# Patient Record
Sex: Female | Born: 1965 | Race: Black or African American | Hispanic: No | Marital: Married | Smoking: Never smoker
Health system: Southern US, Community
[De-identification: ages and names within clinical notes are randomized; demographics above are authoritative.]

## PROBLEM LIST (undated history)

## (undated) HISTORY — PX: BREAST LUMPECTOMY: SHX2

## (undated) HISTORY — PX: HERNIA REPAIR: SHX51

## (undated) HISTORY — PX: BREAST SURGERY: SHX581

---

## 2007-08-06 ENCOUNTER — Ambulatory Visit: Payer: Self-pay | Admitting: Vascular Surgery

## 2010-09-12 ENCOUNTER — Emergency Department (HOSPITAL_BASED_OUTPATIENT_CLINIC_OR_DEPARTMENT_OTHER)
Admission: EM | Admit: 2010-09-12 | Discharge: 2010-09-12 | Payer: Self-pay | Source: Home / Self Care | Admitting: Emergency Medicine

## 2011-01-17 NOTE — Consult Note (Signed)
VASCULAR SURGERY CONSULTATION   Megan Nelson, Megan Nelson  DOB:  Aug 01, 1966                                       08/06/2007  ZHYQM#:[id number]   This is a vascular surgery consultation.   The patient is a 45 year old healthy female patient with a long history  of progressive prominent veins which have become quite painful in both  lower extremities.  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  Ambulation has also become painful to her.  She describes this as a  throbbing, sharp, and burning pain in the legs which involves the thighs  and calves.  She has no history of thrombophlebitis, deep venous  thrombosis, bleeding ulceration, or other venous problems but does  notice progressive swelling as the day wears on.  She has not worn true  elastic graduated compression stockings.  She does elevate the legs  which helps at times and has tried ibuprofen without success.   PAST MEDICAL HISTORY:  Negative for diabetes, hypertension, coronary  artery disease, hyperlipidemia, stroke, or COPD.   PREVIOUS SURGERY:  None.   FAMILY HISTORY:  Negative for coronary artery disease, diabetes, and  stroke.   SOCIAL HISTORY:  She is single, has 1 child, and works as a  Associate Professor.  She does not use tobacco or alcohol.   REVIEW OF SYSTEMS:  Please see health history form.   MEDICATIONS:  Please see health history form.   ALLERGIES:  NONE.   PHYSICAL EXAM:  Blood pressure is 136/80.  Heart rate is 72.  Respirations are 14.  GENERAL:  She is a healthy-appearing, middle-aged  female in no apparent distress.  She is alert and oriented x3.  NECK:  Supple with 3+ carotid pulses.  No bruits are audible.  There is no  palpable adenopathy in the neck.  Upper extremity pulses are 3+  bilaterally.  No skin rashes are noted.  CHEST:  Clear to  auscultation.  CARDIOVASCULAR:  Exam reveals regular rhythm with no murmurs.  ABDOMEN:  Soft and nontender with no palpable masses.  She has 3+ femoral,  popliteal, and posterior tibial pulses bilaterally.  There is no  hyperpigmentation, ulceration, or larger varicosities noted.  She does  have spider and reticular veins bilaterally, particularly in the  anterior and lateral thighs and also extending into the calves laterally  and medially, as well as some posteriorly in the proximal calf over the  popliteal area.  Venous duplex exam was performed in our office today  and both greater saphenous veins are competent.  There is mild  incompetence a the right saphenofemoral junction but the left  saphenofemoral junction is competent, as are both small saphenous veins.   I do not think that laser ablation is indicated in this young lady  although she does painful reticular and spider veins.  The best  treatment would be sclerotherapy and possible some skin laser  treatments.  She will consider this and be back in touch with [place] if she  would like to proceed.   Quita Skye Hart Rochester, M.D.  Electronically Signed  JDL/MEDQ  D:  08/06/2007  T:  08/07/2007  Job:  [id number]   cc:   Melina Schools  Kennon Portela, M.D.

## 2011-01-17 NOTE — Procedures (Signed)
LOWER EXTREMITY VENOUS REFLUX EXAM   INDICATION:  Complains of bilateral lower extremity pain and swelling,  which is worse with walking.  She denies a history of deep or  superficial venous thrombosis.   EXAM:  Using color-flow imaging and pulse Doppler spectral analysis, the  bilateral common femoral, superficial femoral, popliteal, posterior  tibial, greater and lesser saphenous veins are evaluated.  There is no  evidence suggesting deep venous insufficiency in the bilateral lower  extremities.   The left saphenofemoral junction is competent.  The right saphenofemoral  junction is incompetent.  The bilateral greater saphenous veins are  competent with the caliber as described below.   The bilateral proximal short saphenous veins demonstrate competency.   GSV Diameter (used if found to be incompetent only)                                            Right    Left  Proximal Greater Saphenous Vein           cm       cm  Proximal-to-mid-thigh                     cm       cm  Mid thigh                                 cm       cm  Mid-distal thigh                          cm       cm  Distal thigh                              cm       cm  Knee                                      cm       cm   IMPRESSION:  1. Bilateral greater saphenous veins are competent.  2. Bilateral greater saphenous veins are not aneurysmal.  3. Bilateral greater saphenous veins are not tortuous.  4. The bilateral deep venous system is competent.  5. The bilateral lesser saphenous veins are competent.   ___________________________________________  Quita Skye. Hart Rochester, M.D.   AR/MEDQ  D:  08/06/2007  T:  08/06/2007  Job:  [id number]

## 2015-01-15 ENCOUNTER — Emergency Department (HOSPITAL_BASED_OUTPATIENT_CLINIC_OR_DEPARTMENT_OTHER)
Admission: EM | Admit: 2015-01-15 | Discharge: 2015-01-15 | Disposition: A | Discharge status: Home / Self Care | Payer: 59 | Attending: Emergency Medicine | Admitting: Emergency Medicine

## 2015-01-15 ENCOUNTER — Emergency Department (HOSPITAL_BASED_OUTPATIENT_CLINIC_OR_DEPARTMENT_OTHER): Payer: 59

## 2015-01-15 ENCOUNTER — Encounter (HOSPITAL_BASED_OUTPATIENT_CLINIC_OR_DEPARTMENT_OTHER): Payer: Self-pay | Admitting: *Deleted

## 2015-01-15 DIAGNOSIS — M79602 Pain in left arm: Secondary | ICD-10-CM

## 2015-01-15 DIAGNOSIS — M79652 Pain in left thigh: Secondary | ICD-10-CM | POA: Diagnosis not present

## 2015-01-15 DIAGNOSIS — M542 Cervicalgia: Secondary | ICD-10-CM | POA: Diagnosis not present

## 2015-01-15 DIAGNOSIS — R0602 Shortness of breath: Secondary | ICD-10-CM | POA: Diagnosis not present

## 2015-01-15 DIAGNOSIS — R079 Chest pain, unspecified: Secondary | ICD-10-CM | POA: Diagnosis not present

## 2015-01-15 DIAGNOSIS — R51 Headache: Secondary | ICD-10-CM | POA: Insufficient documentation

## 2015-01-15 DIAGNOSIS — R42 Dizziness and giddiness: Secondary | ICD-10-CM | POA: Insufficient documentation

## 2015-01-15 LAB — TROPONIN I: Troponin I: <0.03 ng/mL (ref <0.031)

## 2015-01-15 LAB — CBC WITH DIFFERENTIAL/PLATELET
Basophils Absolute: 0 10*3/uL (ref 0.0–0.1)
Basophils Relative: 0 % (ref 0–1)
Eosinophils Absolute: 0.1 10*3/uL (ref 0.0–0.7)
Eosinophils Relative: 0 % (ref 0–5)
HCT: 37.9 % (ref 36.0–46.0)
Hemoglobin: 12.9 g/dL (ref 12.0–15.0)
Lymphocytes Relative: 17 % (ref 12–46)
Lymphs Abs: 2.5 10*3/uL (ref 0.7–4.0)
MCH: 28.9 pg (ref 26.0–34.0)
MCHC: 34 g/dL (ref 30.0–36.0)
MCV: 84.8 fL (ref 78.0–100.0)
Monocytes Absolute: 0.8 10*3/uL (ref 0.1–1.0)
Monocytes Relative: 6 % (ref 3–12)
Neutro Abs: 11.5 10*3/uL — ABNORMAL HIGH (ref 1.7–7.7)
Neutrophils Relative %: 77 % (ref 43–77)
Platelets: 222 10*3/uL (ref 150–400)
RBC: 4.47 MIL/uL (ref 3.87–5.11)
RDW: 13.7 % (ref 11.5–15.5)
WBC: 14.9 10*3/uL — ABNORMAL HIGH (ref 4.0–10.5)

## 2015-01-15 LAB — BASIC METABOLIC PANEL
Anion gap: 7 (ref 5–15)
BUN: 18 mg/dL (ref 6–20)
CO2: 25 mmol/L (ref 22–32)
Calcium: 8.9 mg/dL (ref 8.9–10.3)
Chloride: 105 mmol/L (ref 101–111)
Creatinine, Ser: 0.75 mg/dL (ref 0.44–1.00)
GFR calc Af Amer: >60 mL/min (ref >60)
GFR calc non Af Amer: >60 mL/min (ref >60)
Glucose, Bld: 93 mg/dL (ref 65–99)
Potassium: 3.6 mmol/L (ref 3.5–5.1)
Sodium: 137 mmol/L (ref 135–145)

## 2015-01-15 MED ORDER — TRAMADOL HCL 50 MG PO TABS: 50.0000 mg | ORAL_TABLET | Freq: Four times a day (QID) | ORAL | Status: DC | PRN

## 2015-01-15 NOTE — ED Provider Notes (Signed)
CSN: [id number]     Arrival date & time 01/15/15  1828 History  This chart was scribed for Megan RazorStephen Kenady Doxtater, MD by Octavia HeirArianna Nassar, ED Scribe. This patient was seen in room MH05/MH05 and the patient's care was started at 6:51 PM.   Chief Complaint  Patient presents with  . Chest Pain   The history is provided by the patient. No language interpreter was used.    HPI Comments: Megan Nelson is a 49 y.o. female who presents to the Emergency Department complaining of intermittent, moderate left sided arm and leg numbness and pain with associated symptoms of SOB onset last night. Patient states the episodes occur randomly and lasts for a few seconds. Patient also notes that she experienced anxiety, dizziness and inability to focus associated with left sided neck pain and left sided headache. Patient reports when she lifts her arm. Patient states she feels a tightness, "weighted feeling" in her chest. She notes when she feels the numbness in her leg the feeling radiates to her chest. Patient has not had a stress test and denies any history of having prior episodes.She denies nausea and diaphoresis. Patient denies past medical history of hypertension, DM, leg swelling, PE/DVT. Patient is not a smoker.   History reviewed. No pertinent past medical history. Past Surgical History  Procedure Laterality Date  . Hernia repair    . Breast surgery     History reviewed. No pertinent family history. History  Substance Use Topics  . Smoking status: Never Smoker   . Smokeless tobacco: Not on file  . Alcohol Use: No   OB History    No data available     Review of Systems  Constitutional: Negative for diaphoresis.  Respiratory: Positive for chest tightness and shortness of breath.   Cardiovascular: Positive for chest pain.  Gastrointestinal: Negative for nausea.  Musculoskeletal: Positive for myalgias, arthralgias and neck pain.  Neurological: Positive for dizziness, numbness and headaches.   Psychiatric/Behavioral: The patient is nervous/anxious.   All other systems reviewed and are negative.     Allergies  Review of patient's allergies indicates no known allergies.  Home Medications   Prior to Admission medications   Not on File    Triage vitals: BP 144/71 mmHg  Pulse 69  Temp(Src) 98.3 F (36.8 C) (Oral)  Resp 16  Ht 5\' 4"  (1.626 m)  Wt 169 lb (76.658 kg)  BMI 28.99 kg/m2  SpO2 100% Physical Exam  Constitutional: She is oriented to person, place, and time. She appears well-developed and well-nourished. No distress.  HENT:  Head: Normocephalic and atraumatic.  Eyes: Right eye exhibits no discharge. Left eye exhibits no discharge.  Neck:  Tenderness to left lateral neck  Cardiovascular: Normal rate.   Pulmonary/Chest: Effort normal and breath sounds normal. No respiratory distress.  Tenderness to left upper chest  Neurological: She is alert and oriented to person, place, and time. She displays normal reflexes. No cranial nerve deficit. She exhibits normal muscle tone. Coordination normal.  Good finger to nose bilaterally   Skin: No rash noted. She is not diaphoretic.  Psychiatric: She has a normal mood and affect. Her behavior is normal.  Nursing note and vitals reviewed.   ED Course  Procedures   DIAGNOSTIC STUDIES: Oxygen Saturation is 100% on RA, normal by my interpretation.  COORDINATION OF CARE:  6:57 PM Will order imaging and blood work. Patient agrees.  Labs Review Labs Reviewed  CBC WITH DIFFERENTIAL/PLATELET - Abnormal; Notable for the following:  WBC 14.9 (*)    Neutro Abs 11.5 (*)    All other components within normal limits  BASIC METABOLIC PANEL  TROPONIN I    Imaging Review Dg Chest 2 View  01/15/2015   CLINICAL DATA:  Chest pain.  EXAM: CHEST  2 VIEW  COMPARISON:  September 12, 2010.  FINDINGS: The heart size and mediastinal contours are within normal limits. Both lungs are clear. No pneumothorax or pleural effusion is  noted. The visualized skeletal structures are unremarkable.  IMPRESSION: No active cardiopulmonary disease.   Electronically Signed   By: Lupita RaiderJames  Green Jr, M.D.   On: 01/15/2015 20:11     EKG Interpretation   Date/Time:  Friday Jan 15 2015 18:50:37 EDT Ventricular Rate:  69 PR Interval:  184 QRS Duration: 82 QT Interval:  428 QTC Calculation: 458 R Axis:   -32 Text Interpretation:  Normal sinus rhythm Left axis deviation Abnormal ECG  ED PHYSICIAN INTERPRETATION AVAILABLE IN [facility] Confirmed by  TEST, Record ([id number]) on 01/16/2015 6:53:22 AM      MDM   Final diagnoses:  Chest pain  Left arm pain  Left thigh pain    49yF with multiple complaints. Cannot correlate all of them. Low suspicion for emergent process though. It has been determined that no acute conditions requiring further emergency intervention are present at this time. The patient has been advised of the diagnosis and plan. I reviewed any labs and imaging including any potential incidental findings. We have discussed signs and symptoms that warrant return to the ED and they are listed in the discharge instructions.    I personally preformed the services scribed in my presence. The recorded information has been reviewed is accurate. Megan RazorStephen Larena Ohnemus, MD.    Megan RazorStephen Thedora Rings, MD 01/17/15 [phone]

## 2015-01-15 NOTE — ED Notes (Signed)
Pt c/o left sided chest "tightness and numbness ' which radiates down left arm also c/o SOB dizziness

## 2015-01-15 NOTE — Discharge Instructions (Signed)

## 2015-03-25 ENCOUNTER — Other Ambulatory Visit: Payer: Self-pay

## 2015-03-25 DIAGNOSIS — Z1231 Encounter for screening mammogram for malignant neoplasm of breast: Secondary | ICD-10-CM

## 2015-03-29 ENCOUNTER — Ambulatory Visit
Admission: RE | Admit: 2015-03-29 | Discharge: 2015-03-29 | Disposition: A | Discharge status: Home / Self Care | Payer: 59 | Source: Ambulatory Visit

## 2015-03-29 DIAGNOSIS — Z1231 Encounter for screening mammogram for malignant neoplasm of breast: Secondary | ICD-10-CM

## 2015-03-30 ENCOUNTER — Other Ambulatory Visit: Payer: Self-pay | Admitting: Internal Medicine

## 2015-03-30 DIAGNOSIS — R928 Other abnormal and inconclusive findings on diagnostic imaging of breast: Secondary | ICD-10-CM

## 2015-04-05 ENCOUNTER — Ambulatory Visit
Admission: RE | Admit: 2015-04-05 | Discharge: 2015-04-05 | Disposition: A | Discharge status: Home / Self Care | Payer: 59 | Source: Ambulatory Visit | Attending: Internal Medicine | Admitting: Internal Medicine

## 2015-04-05 DIAGNOSIS — R928 Other abnormal and inconclusive findings on diagnostic imaging of breast: Secondary | ICD-10-CM

## 2017-09-25 ENCOUNTER — Encounter (HOSPITAL_BASED_OUTPATIENT_CLINIC_OR_DEPARTMENT_OTHER): Payer: Self-pay | Admitting: Emergency Medicine

## 2017-09-25 ENCOUNTER — Emergency Department (HOSPITAL_BASED_OUTPATIENT_CLINIC_OR_DEPARTMENT_OTHER): Payer: BLUE CROSS/BLUE SHIELD

## 2017-09-25 ENCOUNTER — Other Ambulatory Visit: Payer: Self-pay

## 2017-09-25 ENCOUNTER — Emergency Department (HOSPITAL_BASED_OUTPATIENT_CLINIC_OR_DEPARTMENT_OTHER)
Admission: EM | Admit: 2017-09-25 | Discharge: 2017-09-25 | Disposition: A | Discharge status: Home / Self Care | Payer: BLUE CROSS/BLUE SHIELD | Attending: Emergency Medicine | Admitting: Emergency Medicine

## 2017-09-25 DIAGNOSIS — R202 Paresthesia of skin: Secondary | ICD-10-CM | POA: Insufficient documentation

## 2017-09-25 DIAGNOSIS — R079 Chest pain, unspecified: Secondary | ICD-10-CM

## 2017-09-25 LAB — BASIC METABOLIC PANEL
ANION GAP: 6 (ref 5–15)
BUN: 16 mg/dL (ref 6–20)
CALCIUM: 8.5 mg/dL — AB (ref 8.9–10.3)
CO2: 23 mmol/L (ref 22–32)
Chloride: 107 mmol/L (ref 101–111)
Creatinine, Ser: 0.8 mg/dL (ref 0.44–1.00)
GFR calc Af Amer: >60 mL/min (ref >60)
GFR calc non Af Amer: >60 mL/min (ref >60)
Glucose, Bld: 92 mg/dL (ref 65–99)
Potassium: 3.8 mmol/L (ref 3.5–5.1)
Sodium: 136 mmol/L (ref 135–145)

## 2017-09-25 LAB — CBC WITH DIFFERENTIAL/PLATELET
BASOS PCT: 0 %
Basophils Absolute: 0 10*3/uL (ref 0.0–0.1)
EOS ABS: 0.2 10*3/uL (ref 0.0–0.7)
Eosinophils Relative: 2 %
HCT: 38.9 % (ref 36.0–46.0)
Hemoglobin: 13.1 g/dL (ref 12.0–15.0)
LYMPHS ABS: 2.3 10*3/uL (ref 0.7–4.0)
Lymphocytes Relative: 21 %
MCH: 28.6 pg (ref 26.0–34.0)
MCHC: 33.7 g/dL (ref 30.0–36.0)
MCV: 84.9 fL (ref 78.0–100.0)
Monocytes Absolute: 0.8 10*3/uL (ref 0.1–1.0)
Monocytes Relative: 8 %
Neutro Abs: 7.8 10*3/uL — ABNORMAL HIGH (ref 1.7–7.7)
Neutrophils Relative %: 69 %
Platelets: 232 10*3/uL (ref 150–400)
RBC: 4.58 MIL/uL (ref 3.87–5.11)
RDW: 14.1 % (ref 11.5–15.5)
WBC: 11.2 10*3/uL — ABNORMAL HIGH (ref 4.0–10.5)

## 2017-09-25 LAB — TROPONIN I: Troponin I: <0.03 ng/mL (ref <0.03)

## 2017-09-25 MED ORDER — ASPIRIN 81 MG PO CHEW
324.0000 mg | CHEWABLE_TABLET | Freq: Once | ORAL | Status: AC
  Administered 2017-09-25: 324 mg via ORAL
  Filled 2017-09-25: qty 4

## 2017-09-25 NOTE — Discharge Instructions (Signed)
You were evaluated in the emergency department for chest pain that was provoked by using a treadmill.  He also been experiencing some tingling in her right upper arm and right leg.  Your workup currently shows no answers for these symptoms.  You need to follow-up with your primary care doctor to undergo further testing by them.  We recommend you continue an 81 mg baby aspirin a day.  If your symptoms worsen you should represent to the hospital.

## 2017-09-25 NOTE — ED Triage Notes (Addendum)
Pt states she started having left sided numbness to her left arm, progressed to her left leg x 2 days.  Pt started having left sided chest tightness upon jogging on a treadmill.  Pt continues to have pain and numbness.  No cold symptoms, no N/V/D.  No fever.  No sob.

## 2017-09-25 NOTE — ED Provider Notes (Signed)
[facility] EMERGENCY DEPARTMENT Provider Note   CSN: [id number] Arrival date & time: 09/25/17  1057     History   Chief Complaint Chief Complaint  Patient presents with  . Chest Pain    tightness  . Numbness    left sided upper and lower    HPI Megan Nelson is a 52 y.o. female.  The history is provided by the patient.  Chest Pain   This is a new problem. The current episode started yesterday. The problem has been resolved. The pain is associated with exertion. The pain is moderate. The quality of the pain is described as pressure-like. The pain radiates to the left shoulder and left neck. The symptoms are aggravated by exertion. Pertinent negatives include no abdominal pain, no back pain, no cough, no diaphoresis, no dizziness, no fever, no nausea, no palpitations, no shortness of breath, no sputum production, no syncope and no vomiting. She has tried rest for the symptoms. The treatment provided significant relief.  Pertinent negatives for past medical history include no seizures.    52 year old female with no significant past medical history here with feeling left hand and shoulder numbness for 2 days, left thigh numbness for 1 day.  Last evening while using the treadmill which she does 2-3 times a week she experienced some chest tightness that radiated into her right shoulder.  It was moderate severity and improved as soon as she stopped jogging on the treadmill.  She is never had this before.  If she gets short of breath when using the treadmill but she attributes this to deconditioning and it was not worse yesterday.  There is no recent illness.  For the numbness in the left hand she attributes this to sleeping on her arm.  She has no prior history of cardiac disease.  There is no PE risk factors.  No past medical history on file.  There are no active problems to display for this patient.   Past Surgical History:  Procedure Laterality Date  . BREAST SURGERY      . HERNIA REPAIR      OB History    No data available       Home Medications    Prior to Admission medications   Not on File    Family History No family history on file.  Social History Social History   Tobacco Use  . Smoking status: Never Smoker  . Smokeless tobacco: Never Used  Substance Use Topics  . Alcohol use: No  . Drug use: No     Allergies   Patient has no known allergies.   Review of Systems Review of Systems  Constitutional: Negative for chills, diaphoresis and fever.  HENT: Negative for ear pain and sore throat.   Eyes: Negative for pain and visual disturbance.  Respiratory: Negative for cough, sputum production and shortness of breath.   Cardiovascular: Positive for chest pain. Negative for palpitations and syncope.  Gastrointestinal: Negative for abdominal pain, nausea and vomiting.  Genitourinary: Negative for dysuria and hematuria.  Musculoskeletal: Negative for arthralgias and back pain.  Skin: Negative for color change and rash.  Neurological: Negative for dizziness, seizures and syncope.  All other systems reviewed and are negative.    Physical Exam Updated Vital Signs BP 134/86   Pulse 62   Temp 98.4 F (36.9 C)   Resp 16   Ht 5\' 4"  (1.626 m)   Wt 83.9 kg (185 lb)   SpO2 100%   BMI 31.76  kg/m   Physical Exam  Constitutional: She is oriented to person, place, and time. She appears well-developed and well-nourished. No distress.  HENT:  Head: Normocephalic and atraumatic.  Eyes: Conjunctivae are normal.  Neck: Neck supple.  Cardiovascular: Normal rate and regular rhythm.  No murmur heard. Pulmonary/Chest: Effort normal and breath sounds normal. No respiratory distress.  Abdominal: Soft. There is no tenderness.  Musculoskeletal: She exhibits no edema.  Neurological: She is alert and oriented to person, place, and time. She has normal strength. No cranial nerve deficit or sensory deficit. She displays a negative Romberg sign.  Gait normal. GCS eye subscore is 4. GCS verbal subscore is 5. GCS motor subscore is 6.  Skin: Skin is warm and dry.  Psychiatric: She has a normal mood and affect.  Nursing note and vitals reviewed.    ED Treatments / Results  Labs (all labs ordered are listed, but only abnormal results are displayed) Labs Reviewed  BASIC METABOLIC PANEL  TROPONIN I  CBC WITH DIFFERENTIAL/PLATELET    EKG  EKG Interpretation  Date/Time:  Tuesday September 25 2017 11:06:30 EST Ventricular Rate:  70 PR Interval:  160 QRS Duration: 86 QT Interval:  430 QTC Calculation: 464 R Axis:   -39 Text Interpretation:  Normal sinus rhythm Left axis deviation Abnormal ECG no acute st/ts similar to prior 5/16 Confirmed by Meridee Score [phone]) on 09/25/2017 11:54:59 AM       Radiology Dg Chest 2 View  Result Date: 09/25/2017 CLINICAL DATA:  Two days of left hand numbness with onset of left-sided chest tightness today. Nonsmoker. EXAM: CHEST  2 VIEW COMPARISON:  PA and lateral chest x-ray of Jan 15, 2015 FINDINGS: The lungs are adequately inflated. There is no focal infiltrate. There is no pleural effusion. The heart and pulmonary vascularity are normal. The mediastinum is normal in width. There is gentle dextrocurvature centered in the lower thoracic spine which is stable. IMPRESSION: There is no pneumonia nor other acute cardiopulmonary abnormality. Electronically Signed   By: David  [place] M.D.   On: 09/25/2017 12:07   Ct Head Wo Contrast  Result Date: 09/25/2017 CLINICAL DATA:  Left-sided numbness for the past 2 days. EXAM: CT HEAD WITHOUT CONTRAST TECHNIQUE: Contiguous axial images were obtained from the base of the skull through the vertex without intravenous contrast. COMPARISON:  None. FINDINGS: Brain: No evidence of acute infarction, hemorrhage, hydrocephalus, extra-axial collection or mass lesion/mass effect. Vascular: No hyperdense vessel or unexpected calcification. Skull: Normal. Negative for fracture  or focal lesion. Sinuses/Orbits: No acute finding. Other: None. IMPRESSION: Normal noncontrast head CT. Electronically Signed   By: Obie Dredge M.D.   On: 09/25/2017 12:07    Procedures Procedures (including critical care time)  Medications Ordered in ED Medications  aspirin chewable tablet 324 mg (not administered)     Initial Impression / Assessment and Plan / ED Course  I have reviewed the triage vital signs and the nursing notes.  Pertinent labs & imaging results that were available during my care of the patient were reviewed by me and considered in my medical decision making (see chart for details).  Clinical Course as of Sep 26 1206  Tue Sep 25, 2017  1351 Patient's initial workup was negative for any signs of myocardial ischemia and also CT head was negative.  As she is having no symptoms right now I feel patient can be discharged to follow-up with her primary care doctor.  She is very comfortable with this plan.  We discussed that  she should take an aspirin a day and follow-up with her dog can restrict her activity to no significant aerobic activity until she gets an evaluation by her PCP.  [MB]    Clinical Course User Index [MB] Terrilee Files, MD   DDX - ACS, musculoskeletal, CVA, PTX.   Final Clinical Impressions(s) / ED Diagnoses   Final diagnoses:  Paresthesia  Chest pain in adult    ED Discharge Orders    None       Terrilee Files, MD 09/26/17 1210

## 2018-04-10 ENCOUNTER — Other Ambulatory Visit: Payer: Self-pay | Admitting: Internal Medicine

## 2018-04-10 DIAGNOSIS — Z1231 Encounter for screening mammogram for malignant neoplasm of breast: Secondary | ICD-10-CM

## 2018-05-13 ENCOUNTER — Ambulatory Visit: Payer: BLUE CROSS/BLUE SHIELD

## 2018-05-20 ENCOUNTER — Ambulatory Visit
Admission: RE | Admit: 2018-05-20 | Discharge: 2018-05-20 | Disposition: A | Discharge status: Home / Self Care | Payer: BLUE CROSS/BLUE SHIELD | Source: Ambulatory Visit | Attending: Internal Medicine | Admitting: Internal Medicine

## 2018-05-20 DIAGNOSIS — Z1231 Encounter for screening mammogram for malignant neoplasm of breast: Secondary | ICD-10-CM

## 2018-05-21 LAB — HM COLONOSCOPY

## 2018-05-28 DIAGNOSIS — Z6829 Body mass index (BMI) 29.0-29.9, adult: Secondary | ICD-10-CM | POA: Diagnosis not present

## 2018-05-28 DIAGNOSIS — E663 Overweight: Secondary | ICD-10-CM | POA: Diagnosis not present

## 2018-08-20 ENCOUNTER — Encounter: Payer: Self-pay | Admitting: Internal Medicine

## 2018-08-20 ENCOUNTER — Ambulatory Visit: Payer: BLUE CROSS/BLUE SHIELD | Admitting: Internal Medicine

## 2018-08-20 VITALS — BP 110/60 | HR 68 | Temp 98.7°F | Ht 63.0 in | Wt 175.8 lb

## 2018-08-20 DIAGNOSIS — Z6831 Body mass index (BMI) 31.0-31.9, adult: Secondary | ICD-10-CM | POA: Diagnosis not present

## 2018-08-20 DIAGNOSIS — E6609 Other obesity due to excess calories: Secondary | ICD-10-CM | POA: Insufficient documentation

## 2018-08-20 DIAGNOSIS — R635 Abnormal weight gain: Secondary | ICD-10-CM | POA: Diagnosis not present

## 2018-08-20 NOTE — Patient Instructions (Addendum)
Exercising to Lose Weight Exercising can help you to lose weight. In order to lose weight through exercise, you need to do vigorous-intensity exercise. You can tell that you are exercising with vigorous intensity if you are breathing very hard and fast and cannot hold a conversation while exercising. Moderate-intensity exercise helps to maintain your current weight. You can tell that you are exercising at a moderate level if you have a higher heart rate and faster breathing, but you are still able to hold a conversation. How often should I exercise? Choose an activity that you enjoy and set realistic goals. Your health care provider can help you to make an activity plan that works for you. Exercise regularly as directed by your health care provider. This may include:  Doing resistance training twice each week, such as: ? Push-ups. ? Sit-ups. ? Lifting weights. ? Using resistance bands.  Doing a given intensity of exercise for a given amount of time. Choose from these options: ? 150 minutes of moderate-intensity exercise every week. ? 75 minutes of vigorous-intensity exercise every week. ? A mix of moderate-intensity and vigorous-intensity exercise every week.  Children, pregnant women, people who are out of shape, people who are overweight, and older adults may need to consult a health care provider for individual recommendations. If you have any sort of medical condition, be sure to consult your health care provider before starting a new exercise program. What are some activities that can help me to lose weight?  Walking at a rate of at least 4.5 miles an hour.  Jogging or running at a rate of 5 miles per hour.  Biking at a rate of at least 10 miles per hour.  Lap swimming.  Roller-skating or in-line skating.  Cross-country skiing.  Vigorous competitive sports, such as football, basketball, and soccer.  Jumping rope.  Aerobic dancing. How can I be more active in my day-to-day  activities?  Use the stairs instead of the elevator.  Take a walk during your lunch break.  If you drive, park your car farther away from work or school.  If you take public transportation, get off one stop early and walk the rest of the way.  Make all of your phone calls while standing up and walking around.  Get up, stretch, and walk around every 30 minutes throughout the day. What guidelines should I follow while exercising?  Do not exercise so much that you hurt yourself, feel dizzy, or get very short of breath.  Consult your health care provider prior to starting a new exercise program.  Wear comfortable clothes and shoes with good support.  Drink plenty of water while you exercise to prevent dehydration or heat stroke. Body water is lost during exercise and must be replaced.  Work out until you breathe faster and your heart beats faster. This information is not intended to replace advice given to you by your health care provider. Make sure you discuss any questions you have with your health care provider. Document Released: 09/23/2010 Document Revised: 01/27/2016 Document Reviewed: 01/22/2014 Elsevier Interactive Patient Education  2018 [employer]  e

## 2018-09-08 ENCOUNTER — Encounter: Payer: Self-pay | Admitting: Internal Medicine

## 2018-09-08 NOTE — Progress Notes (Signed)
  Subjective:     Patient ID: Megan Nelson , female    DOB: [birth year] , 53 y.o.   MRN: [id number]   Chief Complaint  Patient presents with  . Obesity    HPI  She is here today for f/u obesity. She has stopped using the Saxenda. She does not wish to continue with this medication. She wants to try to lose remaining weight with diet/exercise alone. Additionally, she reports she has chosen a new insurance due to rising costs and will have to find a new pcp next year.     History reviewed. No pertinent past medical history.   Family History  Problem Relation Age of Onset  . Peripheral Artery Disease Mother   . Diabetes Father   . High blood pressure Father      Current Outpatient Medications:  .  cholecalciferol (VITAMIN D3) 25 MCG (1000 UT) tablet, Take 1,000 Units by mouth daily., Disp: , Rfl:  .  levonorgestrel (MIRENA) 20 MCG/24HR IUD, 1 each by Intrauterine route once. Every 5 years, Disp: , Rfl:  .  Liraglutide -Weight Management (SAXENDA [place]), Inject into the skin. Inject 0.43ml by subcutaneous route into the abdomen, thigh or upper arm, Disp: , Rfl:    No Known Allergies   Review of Systems  Constitutional: Negative.   Respiratory: Negative.   Cardiovascular: Negative.   Gastrointestinal: Negative.   Neurological: Negative.   Psychiatric/Behavioral: Negative.      Today's Vitals   08/20/18 1501  BP: 110/60  Pulse: 68  Temp: 98.7 F (37.1 C)  TempSrc: Oral  SpO2: 96%  Weight: 175 lb 12.8 oz (79.7 kg)  Height: 5\' 3"  (1.6 m)  PainSc: 0-No pain   Body mass index is 31.14 kg/m.   Objective:  Physical Exam Vitals signs and nursing note reviewed.  Constitutional:      Appearance: Normal appearance. She is obese.  HENT:     Head: Normocephalic and atraumatic.  Cardiovascular:     Rate and Rhythm: Normal rate and regular rhythm.     Heart sounds: Normal heart sounds.  Pulmonary:     Effort: Pulmonary effort is normal.     Breath sounds: Normal breath  sounds.  Skin:    General: Skin is warm.  Neurological:     General: No focal deficit present.     Mental Status: She is alert.         Assessment And Plan:     1. Abnormal weight gain  Weights reviewed from [employer] (previous charting system). She is encouraged to engage in regular exercise - 30-45 minutes five to six days weekly. She is also encouraged to avoid processed foods including deli meats, white breads, rice and pasta. She already drinks greater than 64 ounces of water daily. Pt advised that I am sorry to see her go, but of course I understand. She has requested recommendation for [facility] provider. Pt advised I will ask around and get back to her.  All questions were answered to her satisfaction.   2. Class 1 obesity due to excess calories without serious comorbidity with body mass index (BMI) of 31.0 to 31.9 in adult  She is encouraged to strive for BMI less than 27 to decrease cardiac risk.   Gwynneth Aliment, MD

## 2020-01-13 IMAGING — MG DIGITAL SCREENING BILATERAL MAMMOGRAM WITH TOMO AND CAD
8 series · 8 of 24 positions shown · non-contrast
Comparison: Previous exam(s).

CLINICAL DATA: Screening.

EXAM:
DIGITAL SCREENING BILATERAL MAMMOGRAM WITH TOMO AND CAD

[L MLO synth-2D]
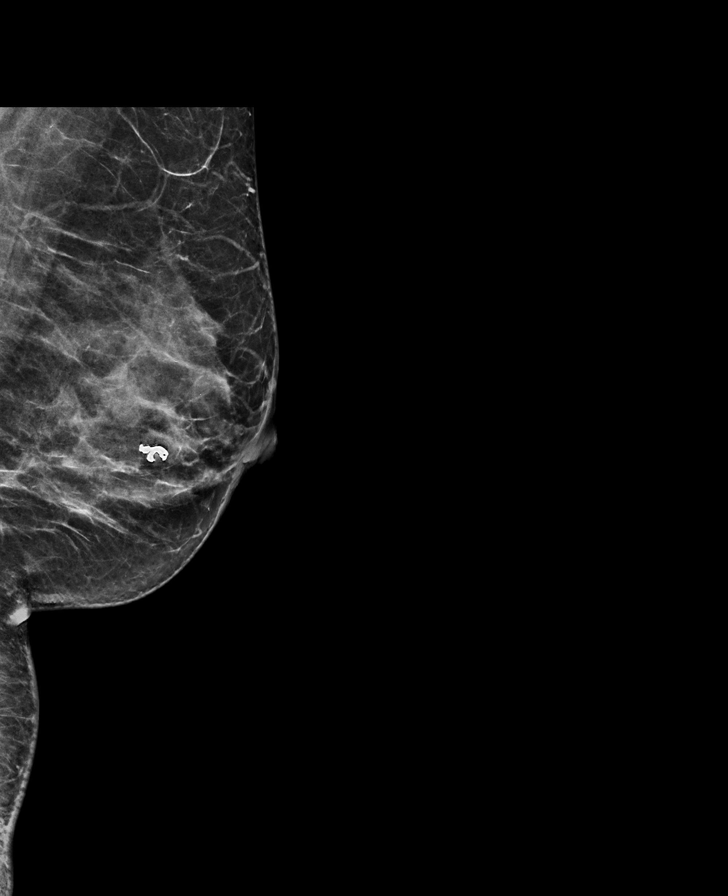

[L CC synth-2D]
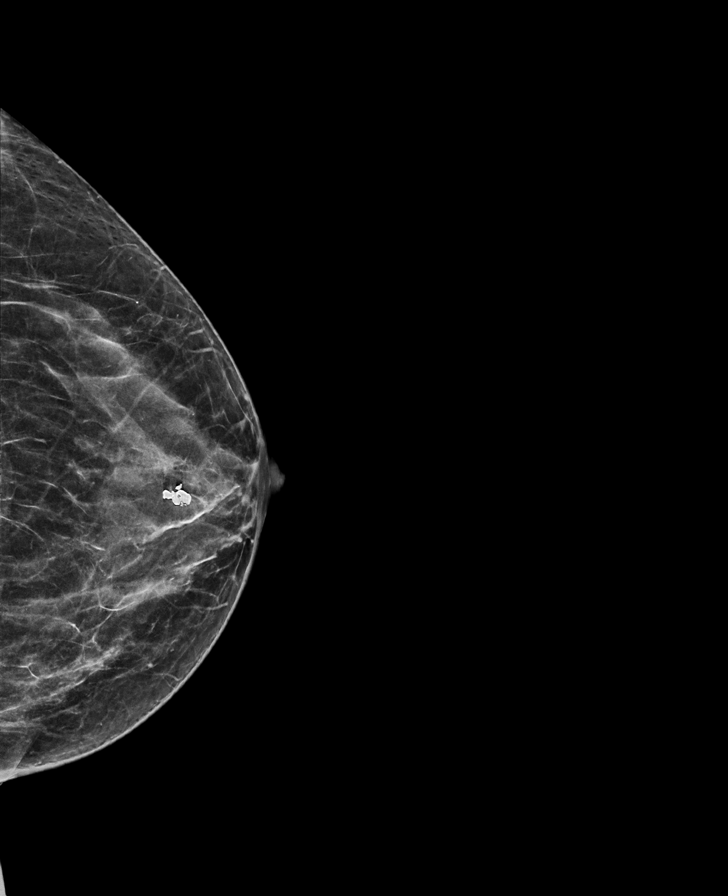

[R MLO synth-2D]
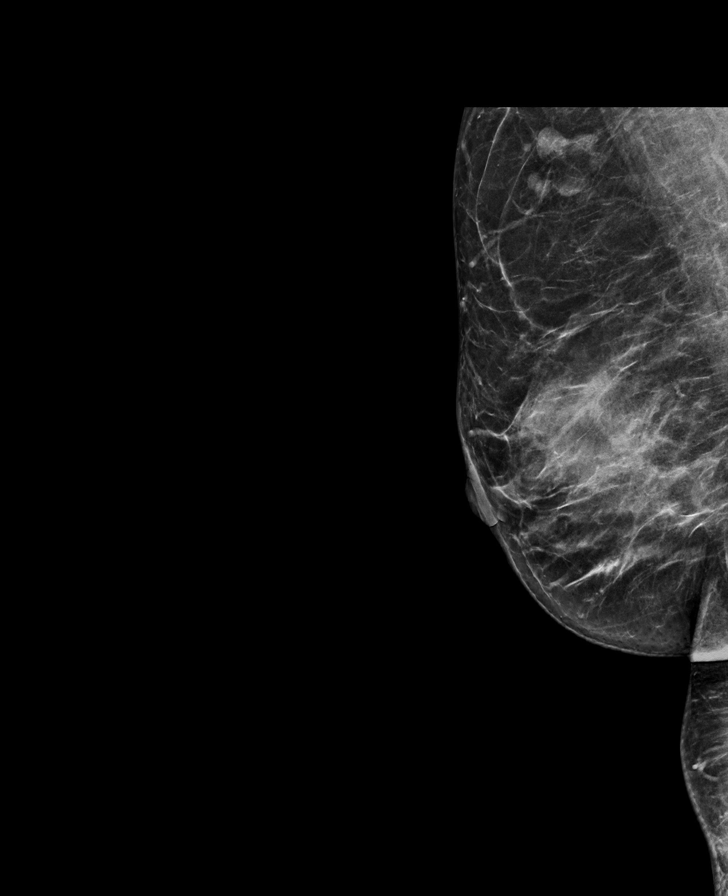

[R CC synth-2D]
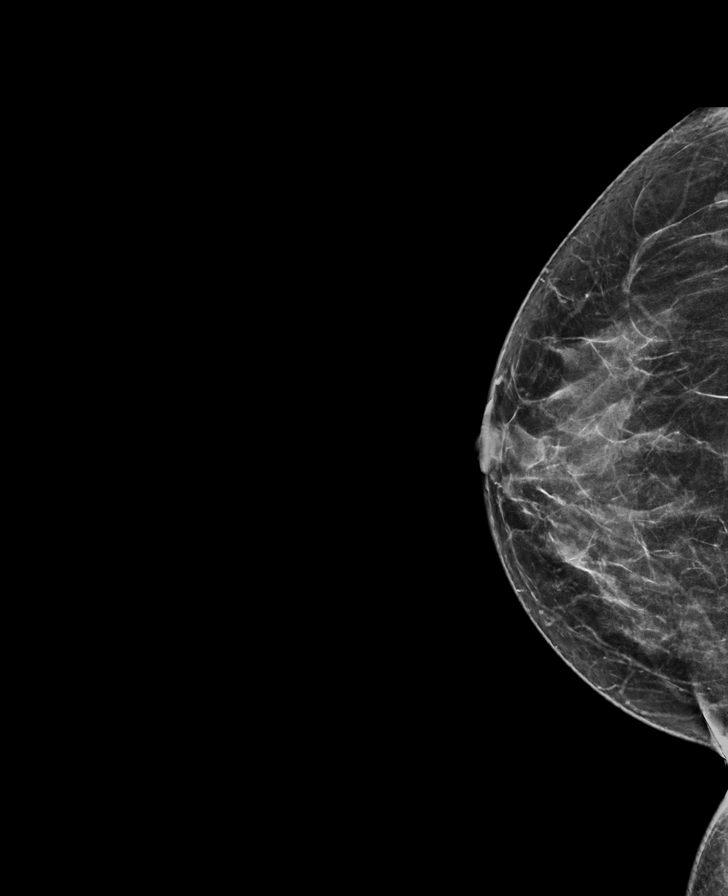

[L MLO tomo · tomo slice 31/61.0]
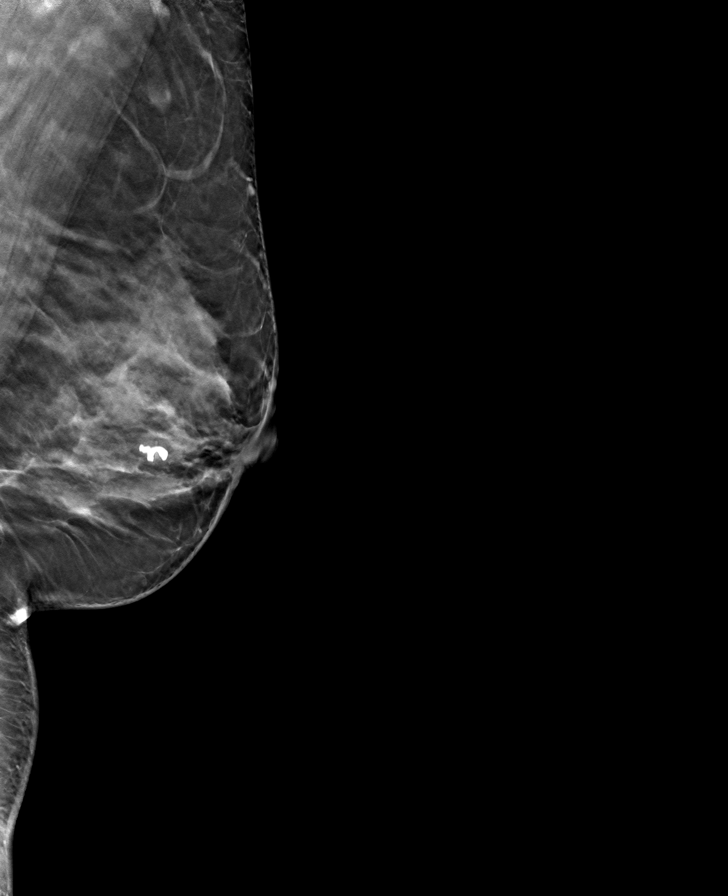

[R MLO tomo · tomo slice 33/64.0]
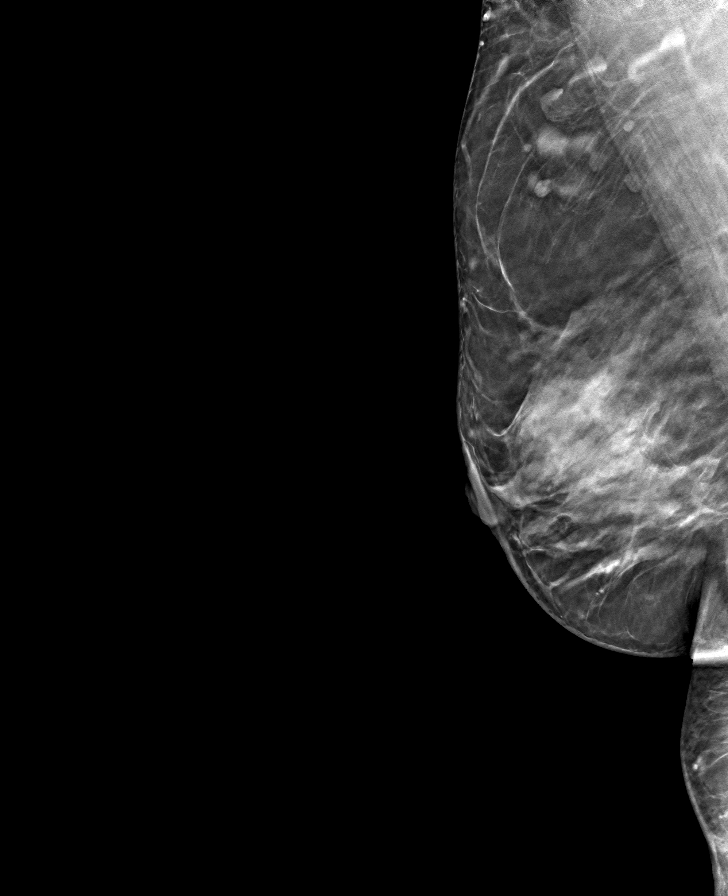

[R CC tomo · tomo slice 31/60.0]
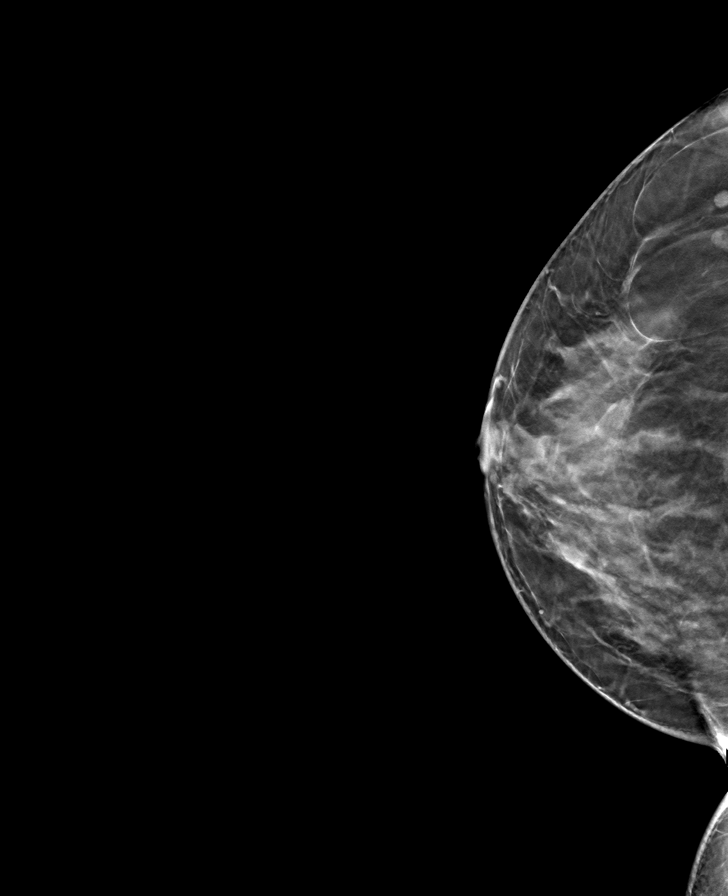

[L CC tomo · tomo slice 27/54.0]
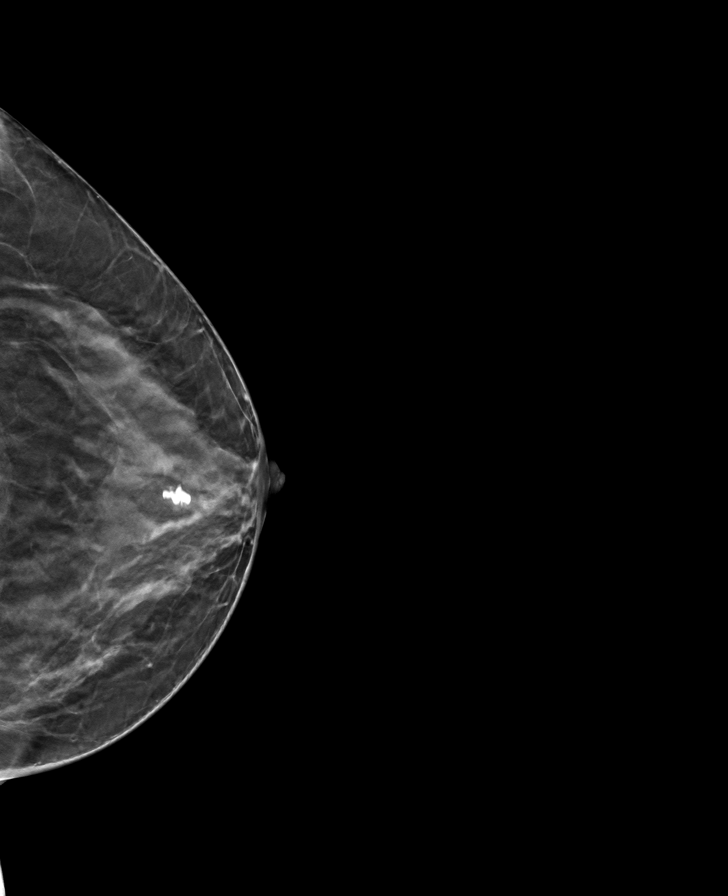

[8 of 24 positions shown; findings below may reference images not displayed]

ACR Breast Density Category c: The breast tissue is heterogeneously
dense, which may obscure small masses.
FINDINGS: There are no findings suspicious for malignancy. Images were
processed with CAD.
IMPRESSION: No mammographic evidence of malignancy. A result letter of this
screening mammogram will be mailed directly to the patient.

RECOMMENDATION:
Screening mammogram in one year. (Code:[id number])

BI-RADS CATEGORY  1: Negative.
